# Patient Record
Sex: Female | Born: 1960 | Race: White | Hispanic: No | Smoking: Never smoker
Health system: Southern US, Community
[De-identification: ages and names within clinical notes are randomized; demographics above are authoritative.]

## PROBLEM LIST (undated history)

## (undated) DIAGNOSIS — D649 Anemia, unspecified: Secondary | ICD-10-CM

---

## 2017-11-04 ENCOUNTER — Emergency Department (HOSPITAL_COMMUNITY)
Admission: EM | Admit: 2017-11-04 | Discharge: 2017-11-05 | Disposition: A | Discharge status: Home / Self Care | Payer: 59 | Attending: Emergency Medicine | Admitting: Emergency Medicine

## 2017-11-04 DIAGNOSIS — Y929 Unspecified place or not applicable: Secondary | ICD-10-CM | POA: Insufficient documentation

## 2017-11-04 DIAGNOSIS — Y907 Blood alcohol level of 200-239 mg/100 ml: Secondary | ICD-10-CM | POA: Diagnosis not present

## 2017-11-04 DIAGNOSIS — R200 Anesthesia of skin: Secondary | ICD-10-CM | POA: Insufficient documentation

## 2017-11-04 DIAGNOSIS — Y999 Unspecified external cause status: Secondary | ICD-10-CM | POA: Diagnosis not present

## 2017-11-04 DIAGNOSIS — S0990XA Unspecified injury of head, initial encounter: Secondary | ICD-10-CM | POA: Insufficient documentation

## 2017-11-04 DIAGNOSIS — W1830XA Fall on same level, unspecified, initial encounter: Secondary | ICD-10-CM | POA: Diagnosis not present

## 2017-11-04 DIAGNOSIS — Y939 Activity, unspecified: Secondary | ICD-10-CM | POA: Insufficient documentation

## 2017-11-04 DIAGNOSIS — F10920 Alcohol use, unspecified with intoxication, uncomplicated: Secondary | ICD-10-CM | POA: Diagnosis not present

## 2017-11-04 DIAGNOSIS — Z043 Encounter for examination and observation following other accident: Secondary | ICD-10-CM | POA: Diagnosis present

## 2017-11-04 HISTORY — DX: Anemia, unspecified: D64.9

## 2017-11-04 NOTE — ED Notes (Signed)
Bed: ZO10 Expected date:  Expected time:  Means of arrival:  Comments: 57 yo F  Fall down steps

## 2017-11-04 NOTE — ED Triage Notes (Signed)
Pt to ED via [facility] from [facility] with c/o of Fall unwitnessed. Pt hit her anterior right forehead with a small hematoma.  Pt does have ETOH on board, A&O x 2.  Pt is c/o of only head pain, no head or back. Pt is able to move all extremities, and pupils equal, reactive.

## 2017-11-05 ENCOUNTER — Emergency Department (HOSPITAL_COMMUNITY): Payer: 59

## 2017-11-05 ENCOUNTER — Encounter (HOSPITAL_COMMUNITY): Payer: Self-pay | Admitting: Emergency Medicine

## 2017-11-05 ENCOUNTER — Other Ambulatory Visit: Payer: Self-pay

## 2017-11-05 LAB — CBC
HCT: 38.1 % (ref 36.0–46.0)
HEMOGLOBIN: 12.8 g/dL (ref 12.0–15.0)
MCH: 30.8 pg (ref 26.0–34.0)
MCHC: 33.6 g/dL (ref 30.0–36.0)
MCV: 91.6 fL (ref 78.0–100.0)
Platelets: 257 10*3/uL (ref 150–400)
RBC: 4.16 MIL/uL (ref 3.87–5.11)
RDW: 12.4 % (ref 11.5–15.5)
WBC: 5.9 10*3/uL (ref 4.0–10.5)

## 2017-11-05 LAB — COMPREHENSIVE METABOLIC PANEL
ALT: 36 U/L (ref 14–54)
AST: 54 U/L — ABNORMAL HIGH (ref 15–41)
Albumin: 4.4 g/dL (ref 3.5–5.0)
Alkaline Phosphatase: 66 U/L (ref 38–126)
Anion gap: 9 (ref 5–15)
BILIRUBIN TOTAL: 0.2 mg/dL — AB (ref 0.3–1.2)
BUN: 12 mg/dL (ref 6–20)
CO2: 22 mmol/L (ref 22–32)
Calcium: 8.9 mg/dL (ref 8.9–10.3)
Chloride: 108 mmol/L (ref 101–111)
Creatinine, Ser: 0.75 mg/dL (ref 0.44–1.00)
GFR calc Af Amer: >60 mL/min (ref >60)
Glucose, Bld: 108 mg/dL — ABNORMAL HIGH (ref 65–99)
POTASSIUM: 3.7 mmol/L (ref 3.5–5.1)
Sodium: 139 mmol/L (ref 135–145)
TOTAL PROTEIN: 7.6 g/dL (ref 6.5–8.1)

## 2017-11-05 LAB — ETHANOL: ALCOHOL ETHYL (B): 221 mg/dL — AB (ref <10)

## 2017-11-05 LAB — I-STAT BETA HCG BLOOD, ED (MC, WL, AP ONLY)

## 2017-11-05 NOTE — ED Provider Notes (Signed)
[facility]-EMERGENCY DEPT Provider Note   CSN: [id number] Arrival date & time: 11/04/17  2352     History   Chief Complaint Chief Complaint  Patient presents with  . Fall  . Alcohol Intoxication    HPI Stefanee Mckell is a 57 y.o. female.  HPI  She presents for evaluation of trip and fall, injuring her head.  She presents by EMS for evaluation.  Cervical collar placed by EMS in the field.  She also complains of a numb feeling in her right index finger and her right great toe.  There was no loss of consciousness.  She admits to drinking alcohol tonight.  She denies chest or back pain.  There are no other known modifying factors.   Past Medical History:  Diagnosis Date  . Anemia     There are no active problems to display for this patient.   History reviewed. No pertinent surgical history.   OB History   None      Home Medications    Prior to Admission medications   Not on File    Family History Family History  Problem Relation Age of Onset  . CAD Other   . Diabetes Other   . Hypertension Other     Social History Social History   Tobacco Use  . Smoking status: Never Smoker  . Smokeless tobacco: Never Used  Substance Use Topics  . Alcohol use: Yes  . Drug use: Never     Allergies   Amoxicillin and Eggs or egg-derived products   Review of Systems Review of Systems  All other systems reviewed and are negative.    Physical Exam Updated Vital Signs BP (!) 141/81 (BP Location: Left Arm)   Pulse 73   Temp 98 F (36.7 C) (Oral)   Resp 19   Ht  (1.676 m)   Wt 62.6 kg (138 lb)   SpO2 100%   BMI 22.27 kg/m   Physical Exam  Constitutional: She is oriented to person, place, and time. She appears well-developed and well-nourished. She appears distressed (Uncomfortable).  HENT:  Head: Normocephalic.  Right forehead contusion without abrasion.  Eyes: Pupils are equal, round, and reactive to light. Conjunctivae and EOM  are normal.  Neck: Phonation normal.  Hard collar on his neck.  Cardiovascular: Normal rate and regular rhythm.  Pulmonary/Chest: Effort normal and breath sounds normal. She exhibits no tenderness.  Abdominal: Soft. She exhibits no distension. There is no tenderness. There is no guarding.  Musculoskeletal: Normal range of motion. She exhibits no edema or deformity.  Neurological: She is alert and oriented to person, place, and time. She exhibits normal muscle tone.  Intact sensation hands and feet bilaterally.  Normal range of motion arms and legs.  Skin: Skin is warm and dry.  Psychiatric: She has a normal mood and affect. Her behavior is normal. Judgment and thought content normal.  Nursing note and vitals reviewed.    ED Treatments / Results  Labs (all labs ordered are listed, but only abnormal results are displayed) Labs Reviewed  COMPREHENSIVE METABOLIC PANEL - Abnormal; Notable for the following components:      Result Value   Glucose, Bld 108 (*)    AST 54 (*)    Total Bilirubin 0.2 (*)    All other components within normal limits  ETHANOL - Abnormal; Notable for the following components:   Alcohol, Ethyl (B) 221 (*)    All other components within normal limits  CBC  RAPID URINE DRUG SCREEN, HOSP PERFORMED  I-STAT BETA HCG BLOOD, ED ([facility], [facility], [facility] ONLY)    EKG None  Radiology Ct Head Wo Contrast  Result Date: 11/05/2017 CLINICAL DATA:  57 year old female with head trauma. EXAM: CT HEAD WITHOUT CONTRAST CT CERVICAL SPINE WITHOUT CONTRAST TECHNIQUE: Multidetector CT imaging of the head and cervical spine was performed following the standard protocol without intravenous contrast. Multiplanar CT image reconstructions of the cervical spine were also generated. COMPARISON:  None. FINDINGS: CT HEAD FINDINGS Brain: The ventricles and sulci appropriate size for patient's age. There is no acute intracranial hemorrhage. No mass effect or midline shift. No extra-axial fluid collection.  Vascular: No hyperdense vessel or unexpected calcification. Skull: Normal. Negative for fracture or focal lesion. Sinuses/Orbits: No acute finding. Other: Large right temporal scalp hematoma. CT CERVICAL SPINE FINDINGS Alignment: No acute subluxation. There is straightening of normal cervical lordosis which may be positional or due to muscle spasm or secondary to degenerative changes. Skull base and vertebrae: No acute fracture. Soft tissues and spinal canal: No prevertebral fluid or swelling. No visible canal hematoma. Disc levels: Multilevel degenerative changes and disc disease most prominent at C4-C7. Upper chest: Negative. Other: None IMPRESSION: 1. No acute intracranial pathology. 2. No acute/traumatic cervical spine pathology. Multilevel degenerative changes. Electronically Signed   By: Elgie Collard M.D.   On: 11/05/2017 01:53   Ct Cervical Spine Wo Contrast  Result Date: 11/05/2017 CLINICAL DATA:  57 year old female with head trauma. EXAM: CT HEAD WITHOUT CONTRAST CT CERVICAL SPINE WITHOUT CONTRAST TECHNIQUE: Multidetector CT imaging of the head and cervical spine was performed following the standard protocol without intravenous contrast. Multiplanar CT image reconstructions of the cervical spine were also generated. COMPARISON:  None. FINDINGS: CT HEAD FINDINGS Brain: The ventricles and sulci appropriate size for patient's age. There is no acute intracranial hemorrhage. No mass effect or midline shift. No extra-axial fluid collection. Vascular: No hyperdense vessel or unexpected calcification. Skull: Normal. Negative for fracture or focal lesion. Sinuses/Orbits: No acute finding. Other: Large right temporal scalp hematoma. CT CERVICAL SPINE FINDINGS Alignment: No acute subluxation. There is straightening of normal cervical lordosis which may be positional or due to muscle spasm or secondary to degenerative changes. Skull base and vertebrae: No acute fracture. Soft tissues and spinal canal: No  prevertebral fluid or swelling. No visible canal hematoma. Disc levels: Multilevel degenerative changes and disc disease most prominent at C4-C7. Upper chest: Negative. Other: None IMPRESSION: 1. No acute intracranial pathology. 2. No acute/traumatic cervical spine pathology. Multilevel degenerative changes. Electronically Signed   By: Elgie Collard M.D.   On: 11/05/2017 01:53    Procedures Procedures (including critical care time)  Medications Ordered in ED Medications - No data to display   Initial Impression / Assessment and Plan / ED Course  I have reviewed the triage vital signs and the nursing notes.  Pertinent labs & imaging results that were available during my care of the patient were reviewed by me and considered in my medical decision making (see chart for details).  Clinical Course as of Nov 05 328  Fri Nov 05, 2017  0245 Normal  I-Stat beta hCG blood, ED [EW]  0245 Normal  cbc [EW]  0245 Markedly elevated  Ethanol(!) [EW]  0245 Normal  Comprehensive metabolic panel(!) [EW]  0245 No acute injury  CT Head Wo Contrast [EW]  0245 No acute injury  CT Cervical Spine Wo Contrast [EW]    Clinical Course User Index [EW] Mancel Bale, MD  Patient Vitals for the past 24 hrs:  BP Temp Temp src Pulse Resp SpO2 Height Weight  11/05/17 0300 (!) 141/81 - - 73 19 100 % - -  11/05/17 0001 - - - - - -  (1.676 m) 62.6 kg (138 lb)  11/04/17 2355 (!) 141/84 98 F (36.7 C) Oral 88 18 99 % - -    2:47 AM Reevaluation with update and discussion. After initial assessment and treatment, an updated evaluation reveals she is alert and comfortable at this time.  She has no further complaints.  Findings discussed with patient and her female friend, all questions answered.Mancel Bale    Nursing Notes Reviewed/ Care Coordinated Applicable Imaging Reviewed Interpretation of Laboratory Data incorporated into ED treatment  The patient appears reasonably screened and/or  stabilized for discharge and I doubt any other medical condition or other [facility] requiring further screening, evaluation, or treatment in the ED at this time prior to discharge.  Plan: Home Medications-continue usual medications, and use OTC analgesia if needed; Home Treatments-rest, fluids, gradually advance diet; return here if the recommended treatment, does not improve the symptoms; Recommended follow up-PCP follow-up PRN.  Return here for worsening symptoms.     Final Clinical Impressions(s) / ED Diagnoses   Final diagnoses:  Injury of head, initial encounter  Alcoholic intoxication without complication [facility])    ED Discharge Orders    None       Mancel Bale, MD 11/05/17 0330

## 2017-11-05 NOTE — Discharge Instructions (Signed)
Return here or see the doctor of your choice as needed for problems.

## 2019-12-22 IMAGING — CT CT CERVICAL SPINE W/O CM
4 of 8 series · 11 of 33 positions shown, 12 images · non-contrast
Comparison: None.

CLINICAL DATA: 56-year-old female with head trauma.

EXAM:
CT HEAD WITHOUT CONTRAST
CT CERVICAL SPINE WITHOUT CONTRAST
TECHNIQUE: Multidetector CT imaging of the head and cervical spine was
performed following the standard protocol without intravenous
contrast. Multiplanar CT image reconstructions of the cervical spine
were also generated.

[Series 8: c spine soft · axial · 0.34mm/px · z∈[-318,-230]mm · 3 of 88 slices shown]
[im 22/88  soft-tissue]
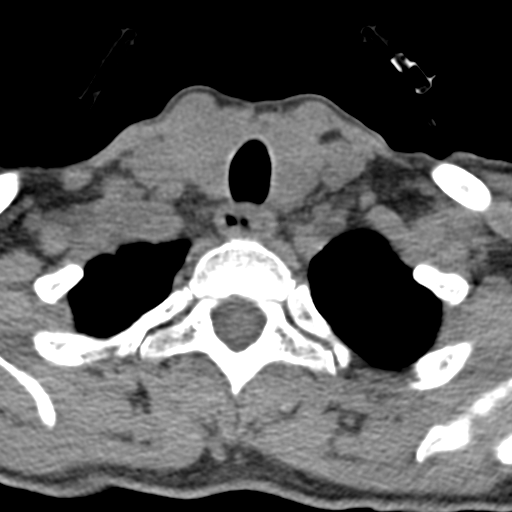
[im 44/88  soft-tissue]
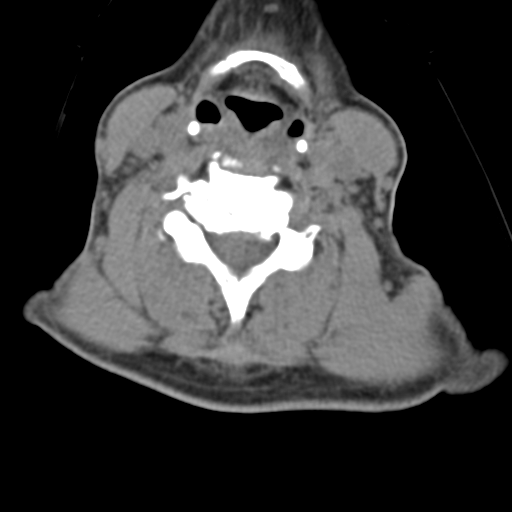
[im 66/88  soft-tissue]
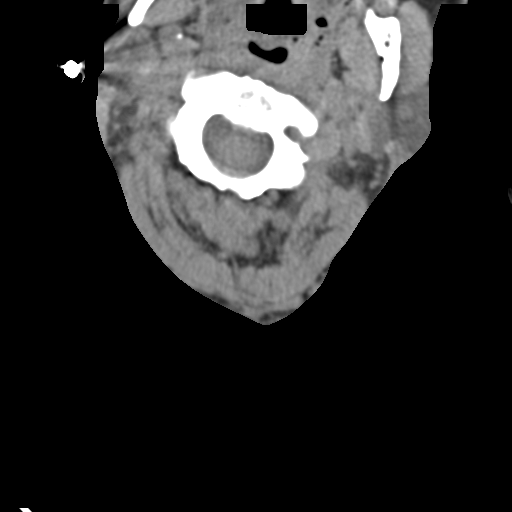

[Series 9: orthogonal bone · axial · 0.19mm/px · z∈[-330,-250]mm · 3 of 89 slices shown, 4 images]
[im 23/89  soft-tissue]
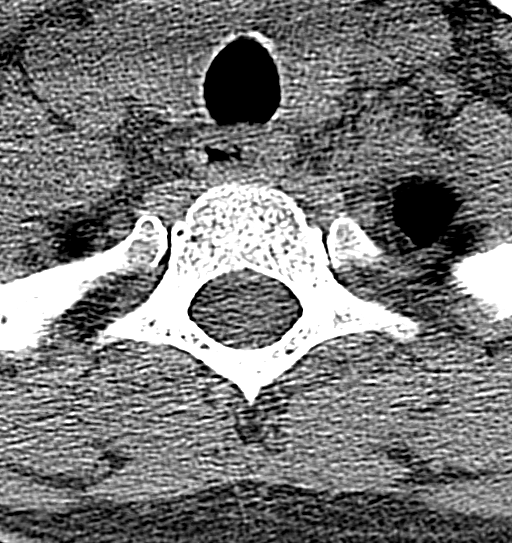
[im 23/89  bone]
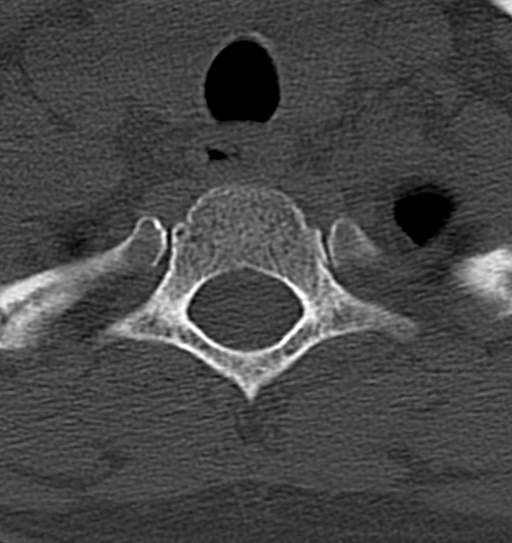
[im 45/89  bone]
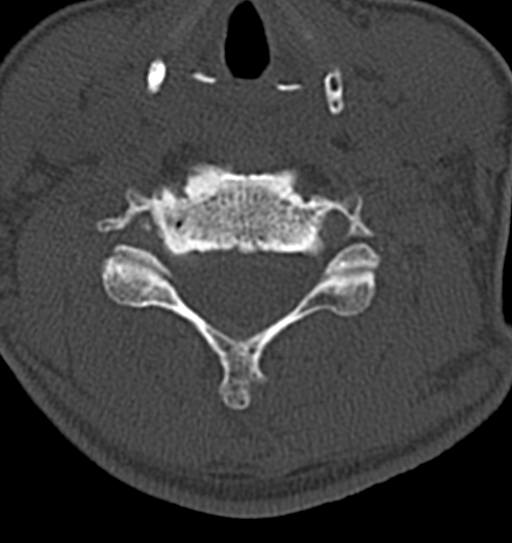
[im 67/89  bone]
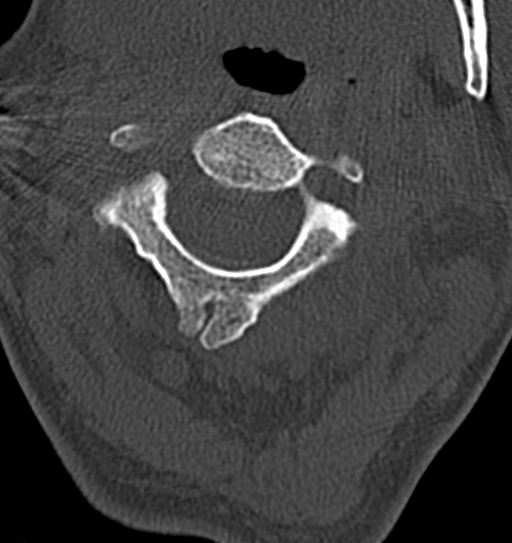

[Series 10: coronal bone · coronal · 0.20mm/px · 1 of 61 slices shown]
[im 31/61  bone]
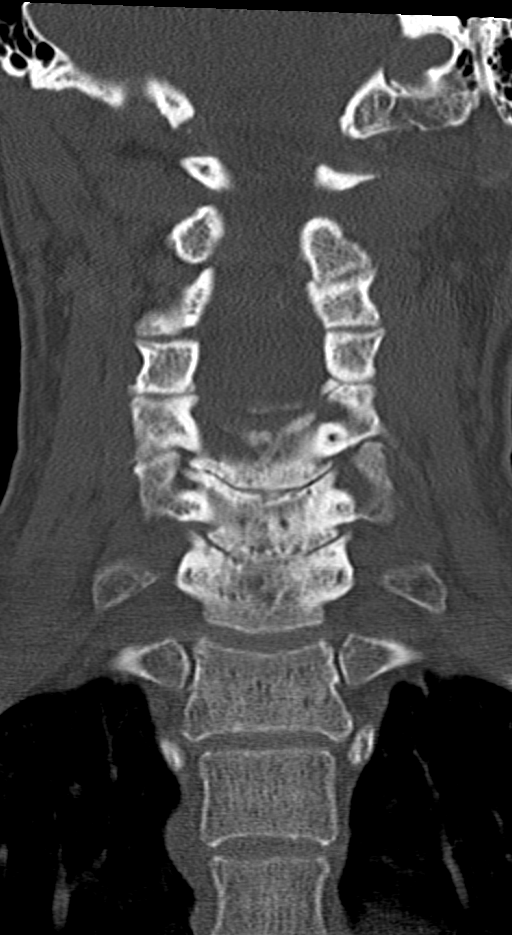

[Series 11: sagittal bone · sagittal · 0.20mm/px · 4 of 47 slices shown]
[im 10/47  bone]
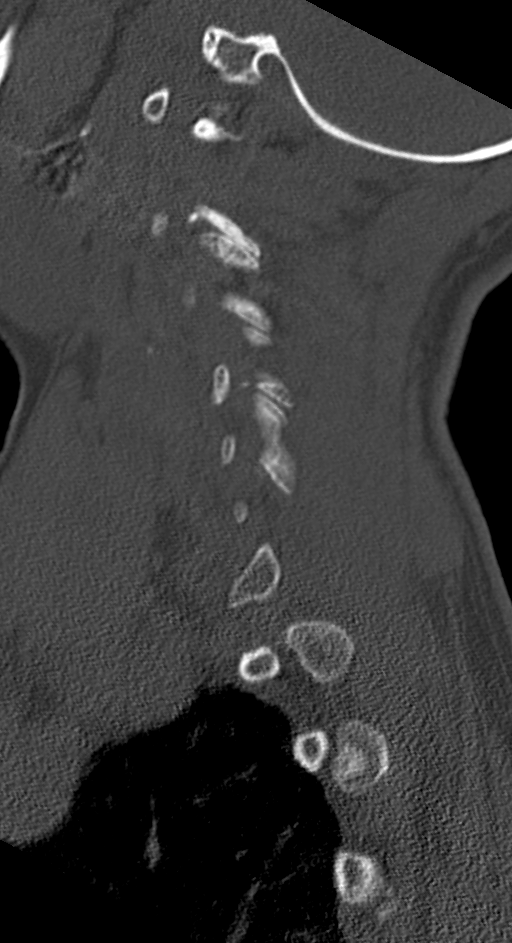
[im 19/47  bone]
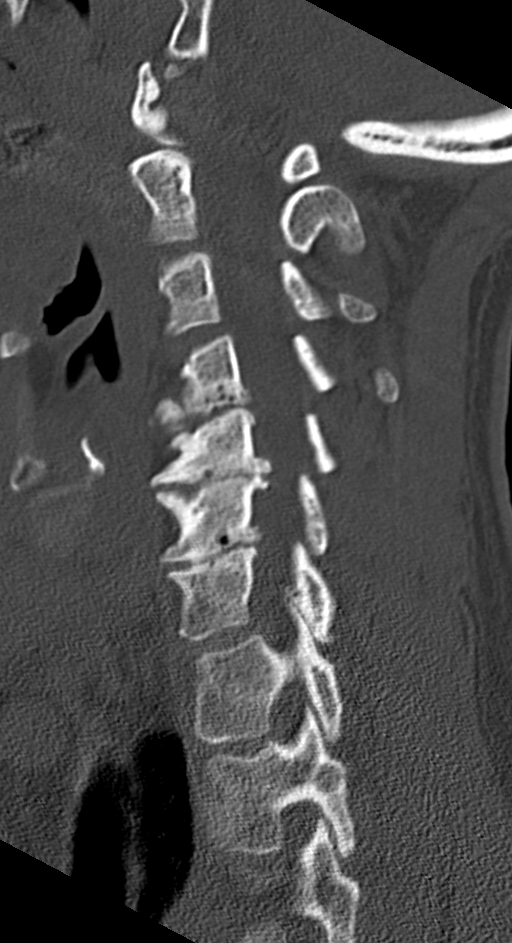
[im 28/47  bone]
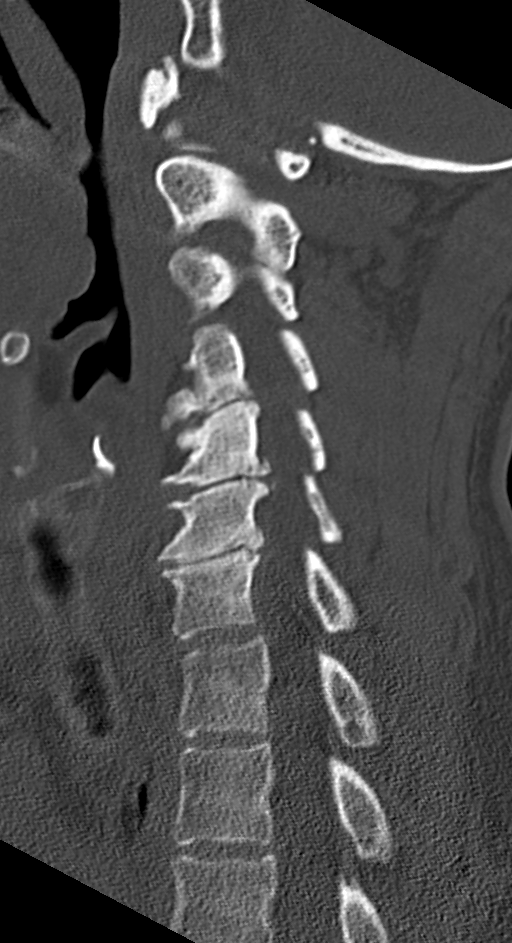
[im 37/47  bone]
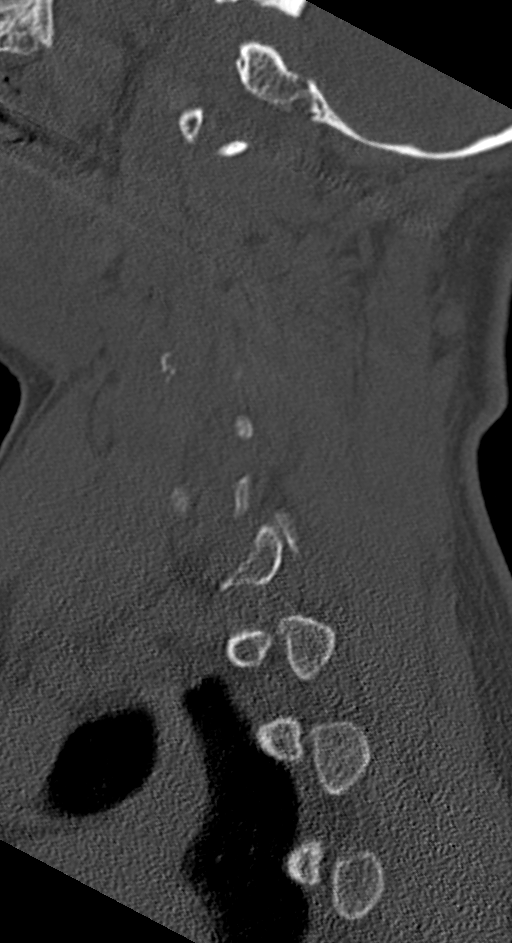

[11 of 33 positions shown; findings below may reference images not displayed]

FINDINGS: CT HEAD FINDINGS

Brain: The ventricles and sulci appropriate size for patient's age.
There is no acute intracranial hemorrhage. No mass effect or midline
shift. No extra-axial fluid collection.

Vascular: No hyperdense vessel or unexpected calcification.

Skull: Normal. Negative for fracture or focal lesion.

Sinuses/Orbits: No acute finding.

Other: Large right temporal scalp hematoma.

CT CERVICAL SPINE FINDINGS

Alignment: No acute subluxation. There is straightening of normal
cervical lordosis which may be positional or due to muscle spasm or
secondary to degenerative changes.

Skull base and vertebrae: No acute fracture.

Soft tissues and spinal canal: No prevertebral fluid or swelling. No
visible canal hematoma.

Disc levels: Multilevel degenerative changes and disc disease most
prominent at C4-C7.

Upper chest: Negative.

Other: None
IMPRESSION: 1. No acute intracranial pathology.
2. No acute/traumatic cervical spine pathology. Multilevel
degenerative changes.
